# Patient Record
Sex: Male | Born: 1995 | Race: Black or African American | Hispanic: No | Marital: Single | Smoking: Never smoker
Health system: Southern US, Community
[De-identification: ages and names within clinical notes are randomized; demographics above are authoritative.]

## PROBLEM LIST (undated history)

## (undated) DIAGNOSIS — Z789 Other specified health status: Secondary | ICD-10-CM

## (undated) HISTORY — DX: Other specified health status: Z78.9

---
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
## 2015-05-08 ENCOUNTER — Encounter (HOSPITAL_COMMUNITY): Payer: Self-pay | Admitting: Family Medicine

## 2015-05-08 ENCOUNTER — Emergency Department (HOSPITAL_COMMUNITY): Payer: Medicaid Other

## 2015-05-08 ENCOUNTER — Emergency Department (HOSPITAL_COMMUNITY)
Admission: EM | Admit: 2015-05-08 | Discharge: 2015-05-08 | Disposition: A | Discharge status: Home / Self Care | Payer: Medicaid Other | Attending: Emergency Medicine | Admitting: Emergency Medicine

## 2015-05-08 DIAGNOSIS — R05 Cough: Secondary | ICD-10-CM | POA: Insufficient documentation

## 2015-05-08 DIAGNOSIS — R0602 Shortness of breath: Secondary | ICD-10-CM

## 2015-05-08 DIAGNOSIS — R059 Cough, unspecified: Secondary | ICD-10-CM

## 2015-05-08 DIAGNOSIS — R091 Pleurisy: Secondary | ICD-10-CM | POA: Diagnosis present

## 2015-05-08 DIAGNOSIS — J3489 Other specified disorders of nose and nasal sinuses: Secondary | ICD-10-CM | POA: Insufficient documentation

## 2015-05-08 DIAGNOSIS — R0781 Pleurodynia: Secondary | ICD-10-CM | POA: Insufficient documentation

## 2015-05-08 DIAGNOSIS — J029 Acute pharyngitis, unspecified: Secondary | ICD-10-CM | POA: Diagnosis not present

## 2015-05-08 MED ORDER — IBUPROFEN 800 MG PO TABS: 800.0000 mg | ORAL_TABLET | Freq: Three times a day (TID) | ORAL | Status: AC

## 2015-05-08 MED ORDER — BENZONATATE 100 MG PO CAPS: 100.0000 mg | ORAL_CAPSULE | Freq: Three times a day (TID) | ORAL | Status: AC | PRN

## 2015-05-08 MED ORDER — IBUPROFEN 400 MG PO TABS
800.0000 mg | ORAL_TABLET | Freq: Once | ORAL | Status: AC
  Administered 2015-05-08: 800 mg via ORAL
  Filled 2015-05-08: qty 2

## 2015-05-08 NOTE — ED Provider Notes (Signed)
CSN: [id number]     Arrival date & time 05/08/15  1042 History  By signing my name below, I, Ricky Weaver, attest that this documentation has been prepared under the direction and in the presence of non-physician practitioner, Ricky Fowler, PA-C. Electronically Signed: Freida Weaver, Scribe. 05/08/2015. 12:09 PM.    Chief Complaint  Patient presents with  . Pleurisy    The history is provided by the patient. No language interpreter was used.   HPI Comments:  Ricky Weaver is a 19 y.o. male who presents to the Emergency Department complaining of cough for 4 days. He reports trouble breathing at night secondary to excessive coughing. He reports associated left sided rib pain with deep breath, sore throat,and rhinorrhea. He denies  wheezing,  h/o asthma, CP, SOB, nausea, vomiting,  ear pain, leg swelling, recent surgery or long periods of immobilization. No hx of DVT/PE. No alleviating factors noted.  Past Medical History  Diagnosis Date  . Medical history non-contributory    History reviewed. No pertinent past surgical history. History reviewed. No pertinent family history. Social History  Substance Use Topics  . Smoking status: Never Smoker   . Smokeless tobacco: None  . Alcohol Use: No    Review of Systems  Constitutional: Negative for fever and chills.  HENT: Positive for rhinorrhea and sore throat.   Respiratory: Positive for cough. Negative for wheezing.   Cardiovascular: Negative for chest pain and leg swelling.  Gastrointestinal: Negative for nausea and vomiting.  All other systems reviewed and are negative.     Allergies  Review of patient's allergies indicates no known allergies.  Home Medications   Prior to Admission medications   Medication Sig Start Date End Date Taking? Authorizing Provider  benzonatate (TESSALON) 100 MG capsule Take 1 capsule (100 mg total) by mouth 3 (three) times daily as needed for cough. 05/08/15   Ricky Fowler, PA-C  ibuprofen  (ADVIL,MOTRIN) 800 MG tablet Take 1 tablet (800 mg total) by mouth 3 (three) times daily. 05/08/15   Ricky Scheel, PA-C   BP 118/65 mmHg  Pulse 52  Temp(Src) 99.1 F (37.3 C) (Oral)  Resp 16  Ht  (1.854 m)  Wt 70.67 kg  BMI 20.56 kg/m2  SpO2 100% Physical Exam  Constitutional: He is oriented to person, place, and time. He appears well-developed and well-nourished. No distress.  HENT:  Head: Normocephalic and atraumatic.  Mouth/Throat: Uvula is midline and oropharynx is clear and moist. No oropharyngeal exudate, posterior oropharyngeal edema, posterior oropharyngeal erythema or tonsillar abscesses.  Eyes: Conjunctivae are normal. Pupils are equal, round, and reactive to light.  Neck: Normal range of motion. Neck supple.  Cardiovascular: Normal rate, regular rhythm and normal heart sounds.   No murmur heard. Pulmonary/Chest: Effort normal and breath sounds normal. No accessory muscle usage or stridor. No respiratory distress. He has no wheezes. He has no rhonchi. He has no rales.  Abdominal: Soft. Bowel sounds are normal. He exhibits no distension. There is no tenderness.  Musculoskeletal: Normal range of motion.  Lymphadenopathy:    He has no cervical adenopathy.  Neurological: He is alert and oriented to person, place, and time.  Speech clear without dysarthria.  Skin: Skin is warm and dry.  Psychiatric: He has a normal mood and affect. His behavior is normal.  Nursing note and vitals reviewed.   ED Course  Procedures   DIAGNOSTIC STUDIES:  Oxygen Saturation is 100% on RA, normal by my interpretation.    COORDINATION OF CARE:  12:04  PM Discussed treatment plan with pt at bedside and pt agreed to plan.  Imaging Review Dg Chest 2 View  05/08/2015  CLINICAL DATA:  19 year old with shortness of breath and chest pain for 3 days. Initial encounter. EXAM: CHEST  2 VIEW COMPARISON:  09/25/2012 radiographs. FINDINGS: The heart size and mediastinal contours are normal. The  lungs are clear. There is no pleural effusion or pneumothorax. No acute osseous findings are identified. IMPRESSION: Stable chest.  No active cardiopulmonary process. Electronically Signed   By: Ricky BullocksWilliam  Weaver M.D.   On: 05/08/2015 12:37   I have personally reviewed and evaluated these images  as part of my medical decision-making.    MDM   Final diagnoses:  Cough    Patient presents with cough x 4 days.  No SOB, CP, or fevers.  No recent sx/trauma, hx of DVT/PE.  VSS, NAD.  PERC negative, doubt PE.  On exam, heart RRR, lung CTAB, abdomen soft and benign.  Uvula midline.  Will give motrin and obtain CXR.  Doubt PNA.  Doubt bronchitis.  Doubt asthma.  Plain films negative.  Will d/c home with motrin and tessalon perle. Evaluation does not show pathology requring ongoing emergent intervention or admission. Pt is hemodynamically stable and mentating appropriately. Discussed findings/results and plan with patient/guardian, who agrees with plan. All questions answered. Return precautions discussed and outpatient follow up given.    I personally performed the services described in this documentation, which was scribed in my presence. The recorded information has been reviewed and is accurate.    Ricky FowlerKayla Griffin Dewilde, PA-C 05/08/15 1248  Ricky BaleElliott Wentz, MD 05/09/15 [id number]

## 2015-05-08 NOTE — ED Notes (Signed)
Pt here for a hard time breathing since Friday. sts wakes him up in the morning. sts hurts when he breathes.

## 2015-05-08 NOTE — Discharge Instructions (Signed)

## 2015-05-08 NOTE — ED Notes (Signed)
Declined W/C at D/C and was escorted to lobby by RN. 

## 2015-12-20 ENCOUNTER — Encounter: Payer: Self-pay | Admitting: Pediatrics

## 2015-12-21 ENCOUNTER — Encounter: Payer: Self-pay | Admitting: Pediatrics

## 2017-07-05 IMAGING — DX DG CHEST 2V
2 series · 2 of 2 positions shown · non-contrast
Comparison: 09/25/2012 radiographs.

CLINICAL DATA: 19-year-old with shortness of breath and chest pain
for 3 days. Initial encounter.

EXAM:
CHEST  2 VIEW

[chest pa]
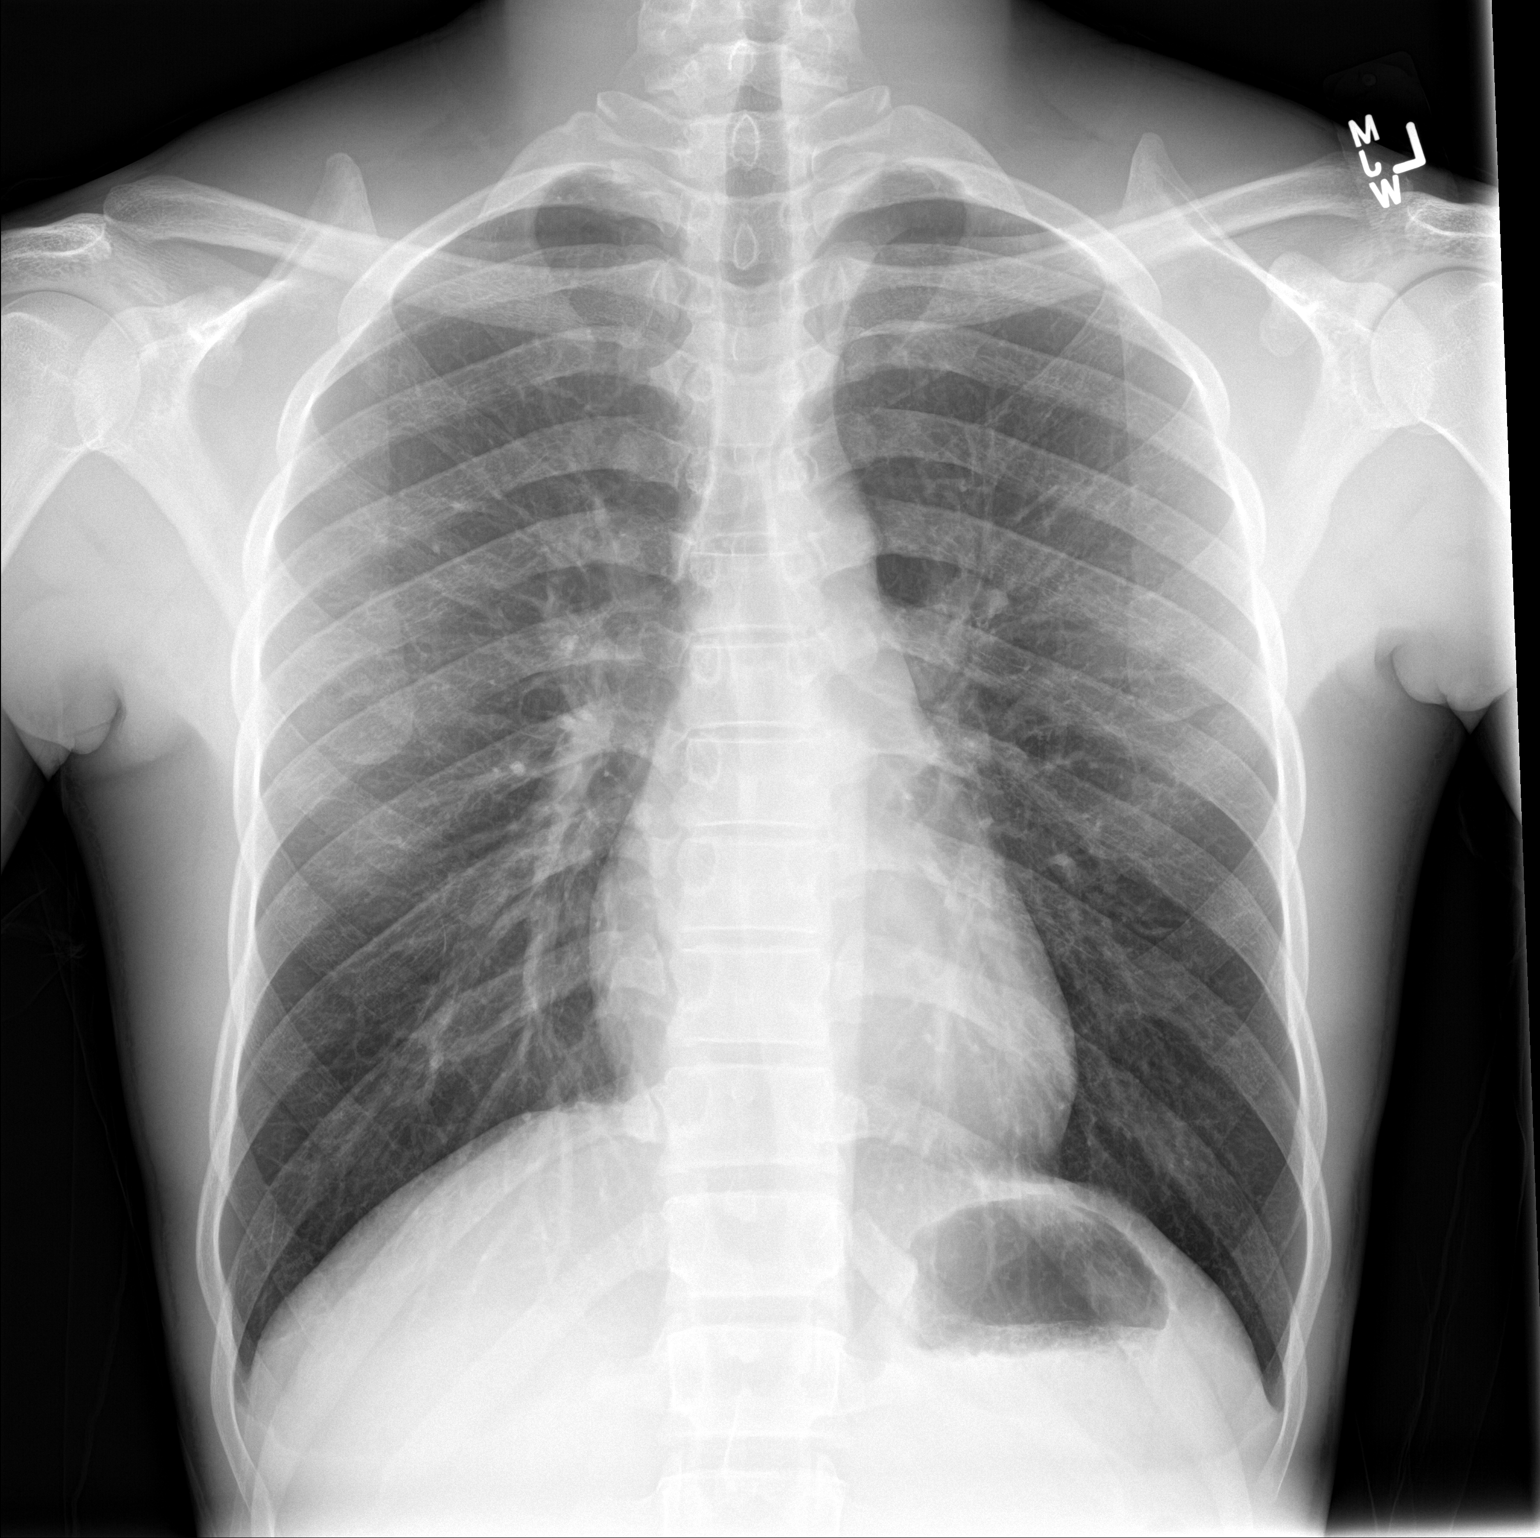

[chest lat]
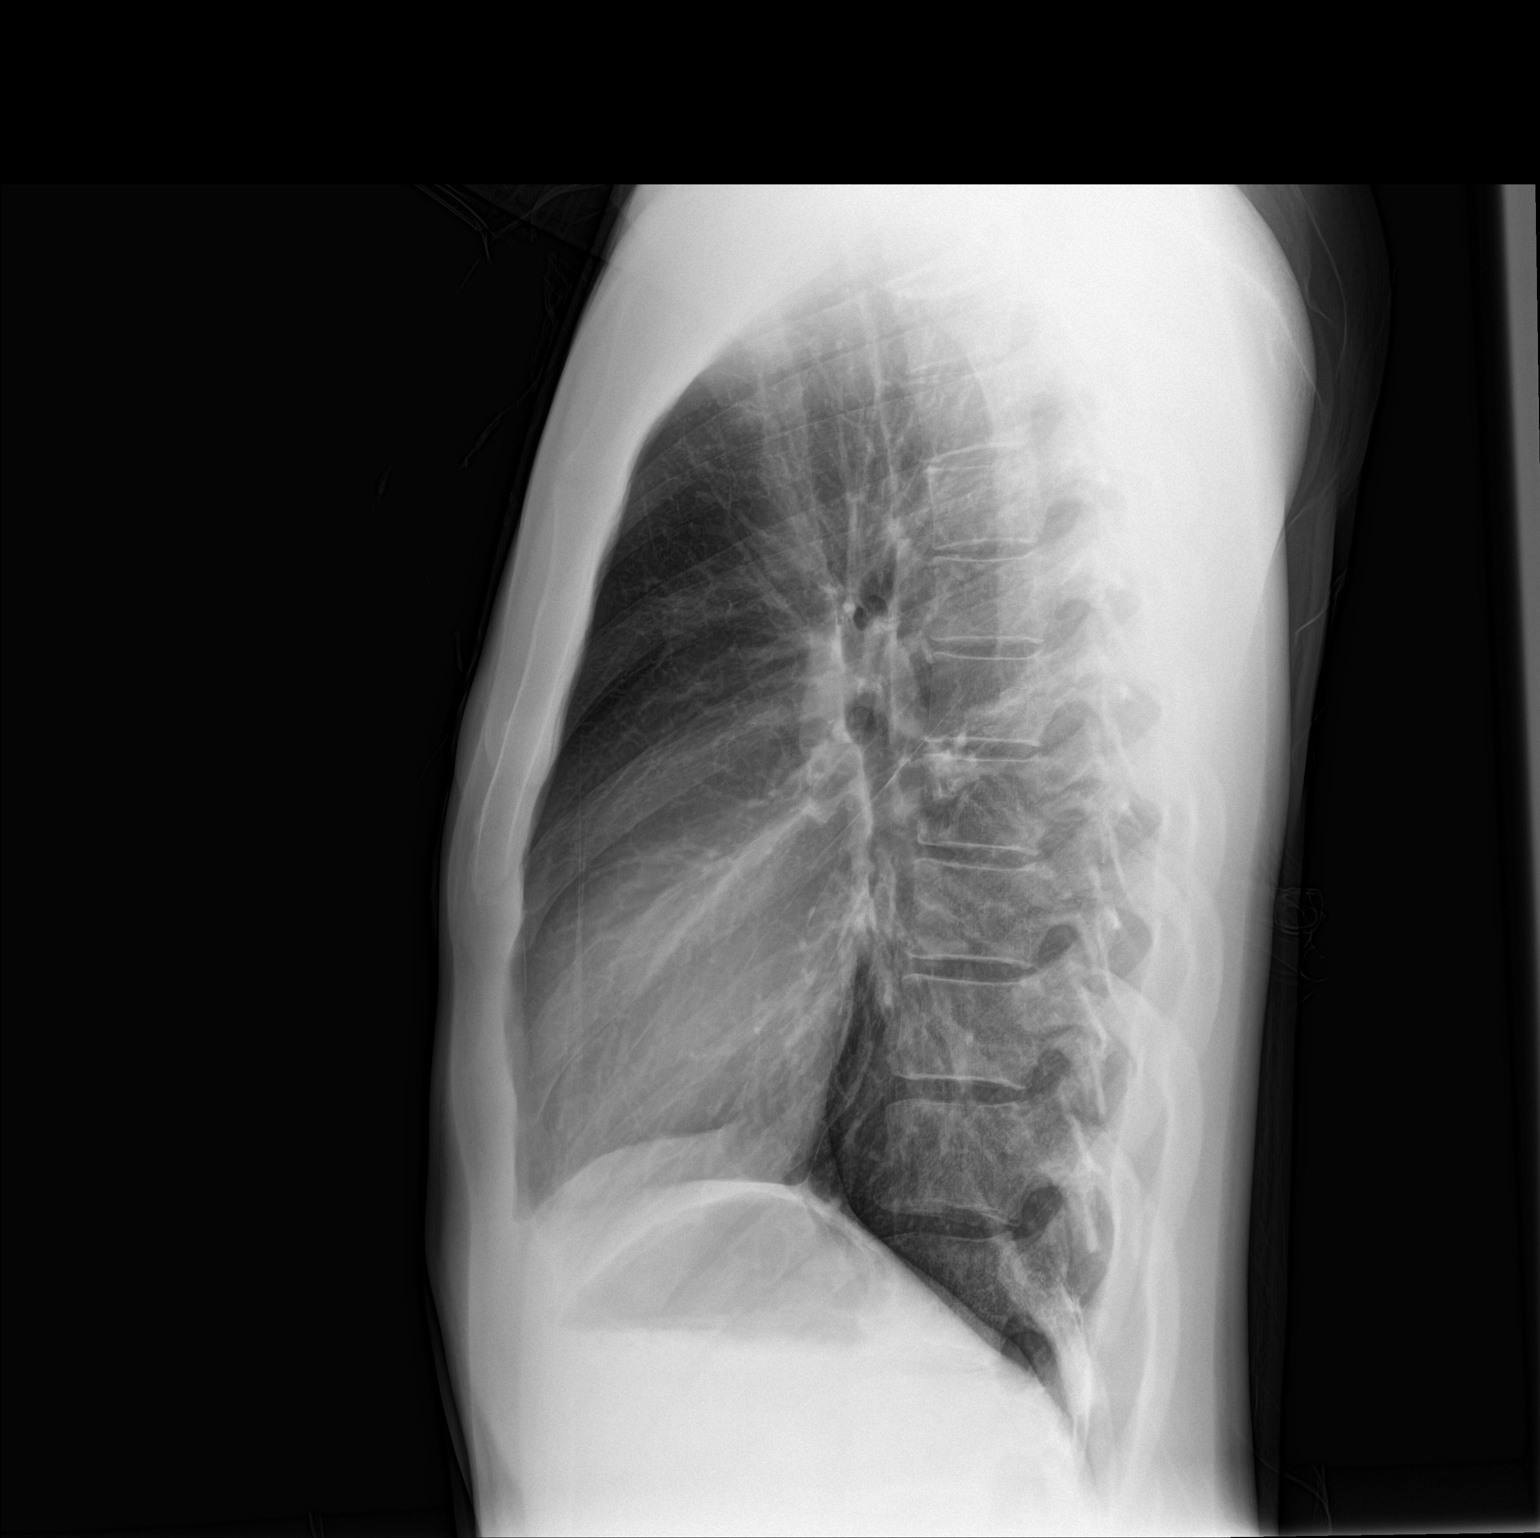

[2 of 2 positions shown; findings below may reference images not displayed]

FINDINGS: The heart size and mediastinal contours are normal. The lungs are
clear. There is no pleural effusion or pneumothorax. No acute
osseous findings are identified.
IMPRESSION: Stable chest.  No active cardiopulmonary process.
